# Patient Record
Sex: Male | Born: 1970 | Race: White | Hispanic: No | Marital: Married | Smoking: Never smoker
Health system: Southern US, Community
[De-identification: ages and names within clinical notes are randomized; demographics above are authoritative.]

## PROBLEM LIST (undated history)

## (undated) DIAGNOSIS — Z4659 Encounter for fitting and adjustment of other gastrointestinal appliance and device: Secondary | ICD-10-CM

## (undated) DIAGNOSIS — F419 Anxiety disorder, unspecified: Secondary | ICD-10-CM

## (undated) DIAGNOSIS — D75829 Heparin-induced thrombocytopenia, unspecified: Secondary | ICD-10-CM

## (undated) DIAGNOSIS — Z8709 Personal history of other diseases of the respiratory system: Secondary | ICD-10-CM

## (undated) DIAGNOSIS — J101 Influenza due to other identified influenza virus with other respiratory manifestations: Secondary | ICD-10-CM

## (undated) DIAGNOSIS — D7582 Heparin induced thrombocytopenia (HIT): Secondary | ICD-10-CM

---

## 1997-12-12 ENCOUNTER — Emergency Department (HOSPITAL_COMMUNITY): Admission: EM | Admit: 1997-12-12 | Discharge: 1997-12-12 | Payer: Self-pay | Admitting: Emergency Medicine

## 2000-06-11 ENCOUNTER — Emergency Department (HOSPITAL_COMMUNITY): Admission: EM | Admit: 2000-06-11 | Discharge: 2000-06-11 | Payer: Self-pay | Admitting: Internal Medicine

## 2000-06-11 ENCOUNTER — Encounter: Payer: Self-pay | Admitting: Internal Medicine

## 2001-02-23 ENCOUNTER — Ambulatory Visit (HOSPITAL_COMMUNITY): Admission: RE | Admit: 2001-02-23 | Discharge: 2001-02-23 | Payer: Self-pay | Admitting: Family Medicine

## 2001-02-23 ENCOUNTER — Encounter: Payer: Self-pay | Admitting: Family Medicine

## 2003-12-13 ENCOUNTER — Ambulatory Visit (HOSPITAL_COMMUNITY): Admission: RE | Admit: 2003-12-13 | Discharge: 2003-12-13 | Payer: Self-pay | Admitting: Family Medicine

## 2004-09-07 ENCOUNTER — Ambulatory Visit (HOSPITAL_COMMUNITY): Admission: RE | Admit: 2004-09-07 | Discharge: 2004-09-07 | Payer: Self-pay | Admitting: Family Medicine

## 2005-10-13 ENCOUNTER — Ambulatory Visit (HOSPITAL_COMMUNITY): Admission: RE | Admit: 2005-10-13 | Discharge: 2005-10-13 | Payer: Self-pay | Admitting: Family Medicine

## 2008-04-11 ENCOUNTER — Emergency Department (HOSPITAL_COMMUNITY): Admission: EM | Admit: 2008-04-11 | Discharge: 2008-04-11 | Payer: Self-pay | Admitting: Emergency Medicine

## 2008-11-25 ENCOUNTER — Emergency Department (HOSPITAL_COMMUNITY): Admission: EM | Admit: 2008-11-25 | Discharge: 2008-11-26 | Payer: Self-pay | Admitting: Emergency Medicine

## 2010-05-21 LAB — BASIC METABOLIC PANEL
BUN: 12 mg/dL (ref 6–23)
CO2: 25 mEq/L (ref 19–32)
Chloride: 111 mEq/L (ref 96–112)
GFR calc Af Amer: >60 mL/min (ref >60)
Potassium: 3.6 mEq/L (ref 3.5–5.1)

## 2010-05-21 LAB — DIFFERENTIAL
Eosinophils Absolute: 0.1 10*3/uL (ref 0.0–0.7)
Eosinophils Relative: 1 % (ref 0–5)
Lymphs Abs: 2 10*3/uL (ref 0.7–4.0)
Monocytes Relative: 9 % (ref 3–12)

## 2010-05-21 LAB — CBC
HCT: 42.7 % (ref 39.0–52.0)
MCV: 85.6 fL (ref 78.0–100.0)
RBC: 4.98 MIL/uL (ref 4.22–5.81)
WBC: 9.5 10*3/uL (ref 4.0–10.5)

## 2011-05-14 ENCOUNTER — Inpatient Hospital Stay (HOSPITAL_COMMUNITY)
Admission: AD | Admit: 2011-05-14 | Payer: BC Managed Care – PPO | Source: Ambulatory Visit | Admitting: Interventional Cardiology

## 2012-04-23 ENCOUNTER — Emergency Department (HOSPITAL_COMMUNITY): Payer: No Typology Code available for payment source

## 2012-04-23 ENCOUNTER — Emergency Department (HOSPITAL_COMMUNITY)
Admission: EM | Admit: 2012-04-23 | Discharge: 2012-04-23 | Disposition: A | Discharge status: Home / Self Care | Payer: No Typology Code available for payment source | Attending: Emergency Medicine | Admitting: Emergency Medicine

## 2012-04-23 ENCOUNTER — Other Ambulatory Visit: Payer: Self-pay

## 2012-04-23 ENCOUNTER — Encounter (HOSPITAL_COMMUNITY): Payer: Self-pay

## 2012-04-23 DIAGNOSIS — F411 Generalized anxiety disorder: Secondary | ICD-10-CM | POA: Insufficient documentation

## 2012-04-23 DIAGNOSIS — I10 Essential (primary) hypertension: Secondary | ICD-10-CM | POA: Insufficient documentation

## 2012-04-23 DIAGNOSIS — R509 Fever, unspecified: Secondary | ICD-10-CM | POA: Insufficient documentation

## 2012-04-23 DIAGNOSIS — Z862 Personal history of diseases of the blood and blood-forming organs and certain disorders involving the immune mechanism: Secondary | ICD-10-CM | POA: Insufficient documentation

## 2012-04-23 DIAGNOSIS — Z7901 Long term (current) use of anticoagulants: Secondary | ICD-10-CM | POA: Insufficient documentation

## 2012-04-23 DIAGNOSIS — Z8709 Personal history of other diseases of the respiratory system: Secondary | ICD-10-CM | POA: Insufficient documentation

## 2012-04-23 DIAGNOSIS — Z79899 Other long term (current) drug therapy: Secondary | ICD-10-CM | POA: Insufficient documentation

## 2012-04-23 DIAGNOSIS — R0602 Shortness of breath: Secondary | ICD-10-CM | POA: Insufficient documentation

## 2012-04-23 DIAGNOSIS — R002 Palpitations: Secondary | ICD-10-CM | POA: Insufficient documentation

## 2012-04-23 DIAGNOSIS — R110 Nausea: Secondary | ICD-10-CM | POA: Insufficient documentation

## 2012-04-23 DIAGNOSIS — R51 Headache: Secondary | ICD-10-CM | POA: Insufficient documentation

## 2012-04-23 HISTORY — DX: Encounter for fitting and adjustment of other gastrointestinal appliance and device: Z46.59

## 2012-04-23 HISTORY — DX: Anxiety disorder, unspecified: F41.9

## 2012-04-23 HISTORY — DX: Heparin-induced thrombocytopenia, unspecified: D75.829

## 2012-04-23 HISTORY — DX: Heparin induced thrombocytopenia (HIT): D75.82

## 2012-04-23 HISTORY — DX: Influenza due to other identified influenza virus with other respiratory manifestations: J10.1

## 2012-04-23 HISTORY — DX: Personal history of other diseases of the respiratory system: Z87.09

## 2012-04-23 LAB — PROTIME-INR
INR: 1.68 — ABNORMAL HIGH (ref 0.00–1.49)
Prothrombin Time: 19.2 seconds — ABNORMAL HIGH (ref 11.6–15.2)

## 2012-04-23 LAB — CBC WITH DIFFERENTIAL/PLATELET
Basophils Absolute: 0 10*3/uL (ref 0.0–0.1)
Eosinophils Relative: 1 % (ref 0–5)
Lymphocytes Relative: 15 % (ref 12–46)
MCV: 84.1 fL (ref 78.0–100.0)
Platelets: 366 10*3/uL (ref 150–400)
RDW: 13.8 % (ref 11.5–15.5)
WBC: 12.3 10*3/uL — ABNORMAL HIGH (ref 4.0–10.5)

## 2012-04-23 LAB — BASIC METABOLIC PANEL
CO2: 23 mEq/L (ref 19–32)
Calcium: 10 mg/dL (ref 8.4–10.5)
GFR calc Af Amer: >90 mL/min (ref >90)
GFR calc non Af Amer: 86 mL/min — ABNORMAL LOW (ref >90)
Sodium: 138 mEq/L (ref 135–145)

## 2012-04-23 LAB — URINALYSIS, ROUTINE W REFLEX MICROSCOPIC
Hgb urine dipstick: NEGATIVE
Leukocytes, UA: NEGATIVE
Protein, ur: NEGATIVE mg/dL
Urobilinogen, UA: 0.2 mg/dL (ref 0.0–1.0)

## 2012-04-23 LAB — TROPONIN I
Troponin I: <0.3 ng/mL (ref <0.30)
Troponin I: <0.3 ng/mL (ref <0.30)

## 2012-04-23 MED ORDER — ONDANSETRON HCL 4 MG/2ML IJ SOLN
4.0000 mg | Freq: Once | INTRAMUSCULAR | Status: AC
  Administered 2012-04-23: 4 mg via INTRAVENOUS

## 2012-04-23 MED ORDER — SODIUM CHLORIDE 0.9 % IV SOLN
INTRAVENOUS | Status: DC
  Administered 2012-04-23: 16:00:00 via INTRAVENOUS

## 2012-04-23 MED ORDER — ONDANSETRON HCL 4 MG/2ML IJ SOLN
INTRAMUSCULAR | Status: AC
  Administered 2012-04-23: 4 mg via INTRAVENOUS
  Filled 2012-04-23: qty 2

## 2012-04-23 MED ORDER — ACETAMINOPHEN 325 MG PO TABS
650.0000 mg | ORAL_TABLET | Freq: Once | ORAL | Status: AC
  Administered 2012-04-23: 650 mg via ORAL
  Filled 2012-04-23: qty 2

## 2012-04-23 MED ORDER — IOHEXOL 350 MG/ML SOLN
100.0000 mL | Freq: Once | INTRAVENOUS | Status: AC | PRN
  Administered 2012-04-23: 100 mL via INTRAVENOUS

## 2012-04-23 MED ORDER — SODIUM CHLORIDE 0.9 % IV BOLUS (SEPSIS)
250.0000 mL | Freq: Once | INTRAVENOUS | Status: AC
  Administered 2012-04-23: 250 mL via INTRAVENOUS

## 2012-04-23 NOTE — ED Notes (Signed)
Discharge instructions given and reviewed with patient.  Patient verbalized understanding to follow up with his primary doctor at [facility].  Patient ambulatory; refused wheelchair.  Discharged home in good condition.

## 2012-04-23 NOTE — ED Provider Notes (Signed)
History    This chart was scribed for Gregory Jakes, MD by Sofie Rower, ED Scribe. The patient was seen in room APA18/AP[facility]18 and the patient's care was started at 3:15PM.    CSN: [id number]  Arrival date & time 04/23/12  1405   First MD Initiated Contact with Patient 04/23/12 1515      Chief Complaint  Patient presents with  . Tachycardia  . Hypertension    (Consider location/radiation/quality/duration/timing/severity/associated sxs/prior treatment) Patient is a 42 y.o. male presenting with general illness. The history is provided by the patient. No language interpreter was used.  Illness  The current episode started today. The onset was sudden. The problem occurs rarely. The problem has been gradually improving. The problem is moderate. The symptoms are relieved by rest. Nothing aggravates the symptoms. Associated symptoms include a fever, nausea and headaches. Pertinent negatives include no abdominal pain, no diarrhea, no neck pain and no rash. The fever has been present for less than 1 day. His temperature was unmeasured prior to arrival. He has been behaving normally. Recently, medical care has been given at another facility (Hospitalized at [facility] for 41 days with H1N1, relased 03/21/12.).    Gregory Barton is a 42 y.o. 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  Associated symptoms include SOB, nausea, fever (unknown maximum temperature PTA, 98.1 taken at [facility] this afternoon, 04/23/12), and headache. The pt reports he was at [employer] this afternoon, when he suddenly began experiencing a fluttering, tachycardic sensation within his chest. The pt informs he believes his heart rate was in the 130's. Furthermore, the pt reports he still  has a feeding tube in place with regards to a prior Hospitalization at [facility], where which he was diagnosed with theH1N1 virus.  Important to note, the pt is currently taking coumadin and his INR measured 1.6 last night (04/22/12).  The pt denies CP, upper extremity pain, vomiting, chills, abdominal pain, diarrhea, dysuria, visual disturbance, rash, neck pain, and back pain.   The pt does not smoke or drink alcohol.   PCP is Dr. Roxy Horseman.    Past Medical History  Diagnosis Date  . H1N1 influenza   . Anxiety   . History of ARDS   . Encounter for feeding tube placement   . Heparin induced thrombocytopenia     History reviewed. No pertinent past surgical history.  No family history on file.  History  Substance Use Topics  . Smoking status: Never Smoker   . Smokeless tobacco: Not on file  . Alcohol Use: No      Review of Systems  Constitutional: Positive for fever. Negative for chills.  HENT: Negative for neck pain.   Eyes: Negative for visual disturbance.  Respiratory: Positive for shortness of breath.   Cardiovascular: Negative for chest pain.  Gastrointestinal: Positive for nausea. Negative for abdominal pain and diarrhea.  Genitourinary: Negative for dysuria.  Musculoskeletal: Negative for back pain and arthralgias.  Skin: Negative for rash.  Neurological: Positive for headaches.    Allergies  Heparin; Lovenox; Meropenem; and Zosyn  Home Medications   Current Outpatient Rx  Name  Route  Sig  Dispense  Refill  . LORazepam (ATIVAN) 1 MG tablet   Oral   Take 0.5-1 mg by mouth every 8 (eight)  hours.         . Multiple Vitamin (MULTIVITAMIN WITH MINERALS) TABS   Oral   Take 1 tablet by mouth every evening.         . sertraline (ZOLOFT) 100 MG tablet   Oral   Take 100 mg by mouth daily.         . tamsulosin (FLOMAX) 0.4 MG CAPS   Oral   Take 0.4 mg by mouth every evening.         . warfarin (COUMADIN) 10 MG tablet   Oral   Take 10-15 mg by  mouth every evening. Alternate taking 15mg  one evening and 10mg  the next evening. (10,15,10,15...)           BP 141/90  Pulse 99  Temp(Src) 98.1 F (36.7 C) (Oral)  Resp 22  Ht 6' (1.829 m)  Wt 225 lb (102.059 kg)  BMI 30.51 kg/m2  SpO2 100%  Physical Exam  Nursing note and vitals reviewed. Constitutional: He is oriented to person, place, and time. He appears well-developed and well-nourished. No distress.  HENT:  Head: Normocephalic and atraumatic.  Nose: Nose normal.  Mouth/Throat: Oropharynx is clear and moist.  Eyes: Conjunctivae and EOM are normal. Pupils are equal, round, and reactive to light. No scleral icterus.  Eyes tracking well, Pupils normal.   Neck: Neck supple. No tracheal deviation present.  Cardiovascular: Normal rate, regular rhythm and normal heart sounds.   Pulmonary/Chest: Effort normal and breath sounds normal. No respiratory distress. He has no wheezes.  Abdominal: Soft. Bowel sounds are normal. He exhibits no distension. There is no tenderness.  LUQ G Tube present.   Musculoskeletal: Normal range of motion. He exhibits no edema.  Neurological: He is alert and oriented to person, place, and time. No cranial nerve deficit or sensory deficit. Coordination normal.  Facial muscles working well.   Skin: Skin is warm and dry.  Psychiatric: He has a normal mood and affect. His behavior is normal.    ED Course  Procedures (including critical care time)  DIAGNOSTIC STUDIES: Oxygen Saturation is 100% on room air, normal by my interpretation.    COORDINATION OF CARE:   3:28 PM- Treatment plan concerning evaluation of coumadin level, heart markers, and head CT discussed with patient. Pt agrees with treatment.  5:40 PM- Recheck. Treatment plan discussed with patient. Pt agrees with treatment.      Results for orders placed during the hospital encounter of 04/23/12  TROPONIN I      Result Value Range   Troponin I <0.30  <0.30 ng/mL  PROTIME-INR       Result Value Range   Prothrombin Time 19.2 (*) 11.6 - 15.2 seconds   INR 1.68 (*) 0.00 - 1.49  URINALYSIS, ROUTINE W REFLEX MICROSCOPIC      Result Value Range   Color, Urine YELLOW  YELLOW   APPearance CLEAR  CLEAR   Specific Gravity, Urine 1.010  1.005 - 1.030   pH 6.5  5.0 - 8.0   Glucose, UA NEGATIVE  NEGATIVE mg/dL   Hgb urine dipstick NEGATIVE  NEGATIVE   Bilirubin Urine NEGATIVE  NEGATIVE   Ketones, ur NEGATIVE  NEGATIVE mg/dL   Protein, ur NEGATIVE  NEGATIVE mg/dL   Urobilinogen, UA 0.2  0.0 - 1.0 mg/dL   Nitrite NEGATIVE  NEGATIVE   Leukocytes, UA NEGATIVE  NEGATIVE  CBC WITH DIFFERENTIAL      Result Value Range   WBC 12.3 (*) 4.0 - 10.5 K/uL  RBC 4.64  4.22 - 5.81 MIL/uL   Hemoglobin 13.7  13.0 - 17.0 g/dL   HCT 16.1  09.6 - 04.5 %   MCV 84.1  78.0 - 100.0 fL   MCH 29.5  26.0 - 34.0 pg   MCHC 35.1  30.0 - 36.0 g/dL   RDW 40.9  81.1 - 91.4 %   Platelets 366  150 - 400 K/uL   Neutrophils Relative 78 (*) 43 - 77 %   Neutro Abs 9.5 (*) 1.7 - 7.7 K/uL   Lymphocytes Relative 15  12 - 46 %   Lymphs Abs 1.9  0.7 - 4.0 K/uL   Monocytes Relative 6  3 - 12 %   Monocytes Absolute 0.8  0.1 - 1.0 K/uL   Eosinophils Relative 1  0 - 5 %   Eosinophils Absolute 0.1  0.0 - 0.7 K/uL   Basophils Relative 0  0 - 1 %   Basophils Absolute 0.0  0.0 - 0.1 K/uL  BASIC METABOLIC PANEL      Result Value Range   Sodium 138  135 - 145 mEq/L   Potassium 3.7  3.5 - 5.1 mEq/L   Chloride 102  96 - 112 mEq/L   CO2 23  19 - 32 mEq/L   Glucose, Bld 90  70 - 99 mg/dL   BUN 14  6 - 23 mg/dL   Creatinine, Ser 7.82  0.50 - 1.35 mg/dL   Calcium 95.6  8.4 - 21.3 mg/dL   GFR calc non Af Amer 86 (*) >90 mL/min   GFR calc Af Amer >90  >90 mL/min  TROPONIN I      Result Value Range   Troponin I <0.30  <0.30 ng/mL   Dg Chest 2 View  04/23/2012  *RADIOLOGY REPORT*  Clinical Data: Weakness and chest pain.  CHEST - 2 VIEW  Comparison: Single view of the chest 11/25/2008.  Findings: The lungs are clear.   Heart size is normal.  No pneumothorax or pleural fluid.  IMPRESSION: No acute abnormality.   Original Report Authenticated By: Holley Dexter, M.D.    Ct Head Wo Contrast  04/23/2012  *RADIOLOGY REPORT*  Clinical Data: Tachycardia, known blood clot in neck, recent H1N1 hospitalization, hypertension  CT HEAD WITHOUT CONTRAST  Technique:  Contiguous axial images were obtained from the base of the skull through the vertex without contrast.  Comparison: None  Findings: Normal ventricular morphology. No midline shift or mass effect. Normal appearance of brain parenchyma. No intracranial hemorrhage, mass lesion, or acute infarction. Visualized paranasal sinuses and mastoid air cells clear. Bones unremarkable.  IMPRESSION: No acute intracranial abnormalities.   Original Report Authenticated By: Ulyses Southward, M.D.    Ct Angio Chest Pe W/cm &/or Wo Cm  04/23/2012  *RADIOLOGY REPORT*  Clinical Data: Tachycardia, hypertension  CT ANGIOGRAPHY CHEST  Technique:  Multidetector CT imaging of the chest using the standard protocol during bolus administration of intravenous contrast. Multiplanar reconstructed images including MIPs were obtained and reviewed to evaluate the vascular anatomy.  Contrast: OMNIPAQUE IOHEXOL 350 MG/ML SOLN  Comparison: Chest radiograph dated 04/23/2012.  CT chest dated 10/13/2005.  Findings: No evidence of pulmonary embolism.  Mild paraseptal emphysematous changes in the bilateral upper lobes. Multiple small pulmonary nodules measuring up to 3-4 mm, unchanged from 2007, benign.  No pleural effusion or pneumothorax.  1.5 cm right thyroid nodule (series 4/image 4).  The heart is normal in size.  No pericardial effusion.  No suspicious mediastinal, hilar, or axillary  lymphadenopathy.  Visualized upper abdomen is unremarkable.  Mild degenerative changes of the visualized thoracolumbar spine.  IMPRESSION: No evidence of pulmonary embolism.  Mild paraseptal emphysematous changes.  Multiple small  pulmonary nodules measuring up to 3-4 mm, unchanged from 2007, benign.  1.5 cm right thyroid nodule.  Consider thyroid ultrasound for further evaluation as clinically warranted.   Original Report Authenticated By: Charline Bills, M.D.           Date: 04/23/2012  Rate: 82  Rhythm: normal sinus rhythm and premature atrial contractions (PAC)  QRS Axis: normal  Intervals: normal  ST/T Wave abnormalities: normal  Conduction Disutrbances:none  Narrative Interpretation:   Old EKG Reviewed: unchanged From 11/25/08   1. Heart palpitations       MDM  Patient followed by primary care doctor at [facility]. Patient was feeling of heart palpitations today he has not had symptoms like that before they now resolved cardiac monitoring showed no significant arrhythmias EKG did have some premature atrial contractions they were present on old he PG as well. Patient is on Coumadin INR is still subtherapeutic he'll need to followup in the next few days for recheck of that. However he recently had increase in his Coumadin dosage so may not had a chance to kick in yet. CT scan angios rules out pulmonary embolism. No evidence of pneumonia no evidence of pneumothorax. Troponin markers x2 with a negative EKG as stated without any acute cardiac findings. Patient's labs without any significant abnormalities.  CT angiographic raise concern for a thyroid nodule that will require followup and probably thyroid function testing. Patient will return for any new or worse symptoms.      I personally performed the services described in this documentation, which was scribed in my presence. The recorded information has been reviewed and is accurate.     Gregory Jakes, MD 04/23/12 2003
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
## 2017-04-15 ENCOUNTER — Other Ambulatory Visit: Payer: Self-pay

## 2017-04-15 ENCOUNTER — Encounter: Payer: Self-pay | Admitting: *Deleted

## 2017-04-15 ENCOUNTER — Emergency Department: Payer: BLUE CROSS/BLUE SHIELD

## 2017-04-15 ENCOUNTER — Emergency Department
Admission: EM | Admit: 2017-04-15 | Discharge: 2017-04-15 | Disposition: A | Discharge status: Home / Self Care | Payer: BLUE CROSS/BLUE SHIELD | Attending: Emergency Medicine | Admitting: Emergency Medicine

## 2017-04-15 DIAGNOSIS — S161XXA Strain of muscle, fascia and tendon at neck level, initial encounter: Secondary | ICD-10-CM | POA: Insufficient documentation

## 2017-04-15 DIAGNOSIS — Y92410 Unspecified street and highway as the place of occurrence of the external cause: Secondary | ICD-10-CM | POA: Insufficient documentation

## 2017-04-15 DIAGNOSIS — Y999 Unspecified external cause status: Secondary | ICD-10-CM | POA: Insufficient documentation

## 2017-04-15 DIAGNOSIS — S39012A Strain of muscle, fascia and tendon of lower back, initial encounter: Secondary | ICD-10-CM | POA: Diagnosis not present

## 2017-04-15 DIAGNOSIS — S199XXA Unspecified injury of neck, initial encounter: Secondary | ICD-10-CM | POA: Diagnosis present

## 2017-04-15 DIAGNOSIS — Y9389 Activity, other specified: Secondary | ICD-10-CM | POA: Diagnosis not present

## 2017-04-15 MED ORDER — OXYCODONE-ACETAMINOPHEN 5-325 MG PO TABS: 1.0000 | ORAL_TABLET | Freq: Three times a day (TID) | ORAL | 0 refills | Status: AC | PRN

## 2017-04-15 MED ORDER — DIAZEPAM 5 MG PO TABS: 5.0000 mg | ORAL_TABLET | Freq: Three times a day (TID) | ORAL | 0 refills | Status: AC | PRN

## 2017-04-15 MED ORDER — OXYCODONE-ACETAMINOPHEN 5-325 MG PO TABS
2.0000 | ORAL_TABLET | Freq: Once | ORAL | Status: AC
  Administered 2017-04-15: 2 via ORAL
  Filled 2017-04-15: qty 2
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
## 2017-06-02 ENCOUNTER — Ambulatory Visit: Payer: Self-pay | Admitting: Psychiatry
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
## 2019-10-09 ENCOUNTER — Other Ambulatory Visit: Payer: Self-pay | Admitting: Emergency Medicine

## 2019-10-09 DIAGNOSIS — M5412 Radiculopathy, cervical region: Secondary | ICD-10-CM

## 2019-10-20 ENCOUNTER — Other Ambulatory Visit: Payer: No Typology Code available for payment source

## 2019-11-17 ENCOUNTER — Other Ambulatory Visit: Payer: No Typology Code available for payment source

## 2021-04-15 ENCOUNTER — Ambulatory Visit
Admission: RE | Admit: 2021-04-15 | Discharge: 2021-04-15 | Disposition: A | Discharge status: Home / Self Care | Payer: 59 | Source: Ambulatory Visit

## 2021-04-15 ENCOUNTER — Other Ambulatory Visit: Payer: Self-pay

## 2021-04-15 VITALS — BP 131/77 | HR 72 | Temp 98.8°F | Resp 18

## 2021-04-15 DIAGNOSIS — J0100 Acute maxillary sinusitis, unspecified: Secondary | ICD-10-CM

## 2021-04-15 MED ORDER — AMOXICILLIN-POT CLAVULANATE 875-125 MG PO TABS: 1.0000 | ORAL_TABLET | Freq: Two times a day (BID) | ORAL | 0 refills | Status: AC

## 2021-04-15 MED ORDER — PREDNISONE 20 MG PO TABS: 40.0000 mg | ORAL_TABLET | Freq: Every day | ORAL | 0 refills | Status: AC

## 2021-04-15 NOTE — ED Provider Notes (Signed)
?[facility] ? ? ? ?CSN: [id number] ?Arrival date & time: 04/15/21  1200 ? ? ?  ? ?History   ?Chief Complaint ?No chief complaint on file. ? ? ?HPI ?Gregory Barton is a 51 y.o. male.  ? ?Presenting today with 1 week history of thick nasal congestion, sinus pain and pressure, sinus headache, malaise.  Denies chest pain, shortness of breath, fever, chills, body aches, abdominal pain, nausea vomiting or diarrhea.  Taking Mucinex, Claritin with minimal relief of symptoms.  Takes Claritin daily for seasonal allergies.  No known sick contacts recently. ? ?Past Medical History:  ?Diagnosis Date  ? Anxiety   ? Encounter for feeding tube placement   ? H1N1 influenza   ? Heparin induced thrombocytopenia   ? History of ARDS   ? ?There are no problems to display for this patient. ? ?History reviewed. No pertinent surgical history. ? ? ? ? ?Home Medications   ? ?Prior to Admission medications   ?Medication Sig Start Date End Date Taking? Authorizing Provider  ?amoxicillin-clavulanate (AUGMENTIN) 875-125 MG tablet Take 1 tablet by mouth every 12 (twelve) hours. 04/15/21  Yes Volney American, PA-C  ?escitalopram (LEXAPRO) 10 MG tablet Take 10 mg by mouth daily.   Yes [provider]  ?loratadine (CLARITIN) 10 MG tablet Take 10 mg by mouth daily.   Yes [provider]  ?losartan (COZAAR) 25 MG tablet Take 25 mg by mouth daily.   Yes [provider]  ?predniSONE (DELTASONE) 20 MG tablet Take 2 tablets (40 mg total) by mouth daily with breakfast. 04/15/21  Yes Volney American, PA-C  ?diazepam (VALIUM) 5 MG tablet Take 1 tablet (5 mg total) by mouth every 8 (eight) hours as needed for muscle spasms. 04/15/17   Earleen Newport, MD  ?LORazepam (ATIVAN) 1 MG tablet Take 0.5-1 mg by mouth every 8 (eight) hours.    [provider]  ?Multiple Vitamin (MULTIVITAMIN WITH MINERALS) TABS Take 1 tablet by mouth every evening.    [provider]  ?oxyCODONE-acetaminophen  (PERCOCET) 5-325 MG tablet Take 1-2 tablets by mouth every 8 (eight) hours as needed. 04/15/17   Earleen Newport, MD  ?sertraline (ZOLOFT) 100 MG tablet Take 100 mg by mouth daily.    [provider]  ?tamsulosin (FLOMAX) 0.4 MG CAPS Take 0.4 mg by mouth every evening.    [provider]  ?warfarin (COUMADIN) 10 MG tablet Take 10-15 mg by mouth every evening. Alternate taking 15mg  one evening and 10mg  the next evening. (10,15,10,15...)    [provider]  ? ? ?Family History ?History reviewed. No pertinent family history. ? ?Social History ?Social History  ? ?Tobacco Use  ? Smoking status: Never  ? Smokeless tobacco: Never  ?Substance Use Topics  ? Alcohol use: No  ? Drug use: No  ? ? ? ?Allergies   ?Heparin, Lovenox [enoxaparin sodium], Meropenem, and Zosyn [piperacillin sod-tazobactam so] ? ? ?Review of Systems ?Review of Systems ?Per HPI ? ?Physical Exam ?Triage Vital Signs ?ED Triage Vitals  ?Enc Vitals Group  ?   BP 04/15/21 1215 131/77  ?   Pulse Rate 04/15/21 1215 72  ?   Resp 04/15/21 1215 18  ?   Temp 04/15/21 1215 98.8 ?F (37.1 ?C)  ?   Temp Source 04/15/21 1215 Oral  ?   SpO2 04/15/21 1215 97 %  ?   Weight --   ?   Height --   ?   Head Circumference --   ?  Peak Flow --   ?   Pain Score 04/15/21 1216 0  ?   Pain Loc --   ?   Pain Edu? --   ?   Excl. in [place]? --   ? ?No data found. ? ?Updated Vital Signs ?BP 131/77 (BP Location: Right Arm)   Pulse 72   Temp 98.8 ?F (37.1 ?C) (Oral)   Resp 18   SpO2 97%  ? ?Visual Acuity ?Right Eye Distance:   ?Left Eye Distance:   ?Bilateral Distance:   ? ?Right Eye Near:   ?Left Eye Near:    ?Bilateral Near:    ? ?Physical Exam ?Vitals and nursing note reviewed.  ?Constitutional:   ?   Appearance: He is well-developed.  ?HENT:  ?   Head: Atraumatic.  ?   Right Ear: External ear normal.  ?   Left Ear: External ear normal.  ?   Nose: Congestion present.  ?   Comments: Bilateral maxillary sinuses tender to palpation ?   Mouth/Throat:  ?    Pharynx: Posterior oropharyngeal erythema present. No oropharyngeal exudate.  ?Eyes:  ?   Conjunctiva/sclera: Conjunctivae normal.  ?   Pupils: Pupils are equal, round, and reactive to light.  ?Cardiovascular:  ?   Rate and Rhythm: Normal rate and regular rhythm.  ?Pulmonary:  ?   Effort: Pulmonary effort is normal. No respiratory distress.  ?   Breath sounds: No wheezing or rales.  ?Musculoskeletal:     ?   General: Normal range of motion.  ?   Cervical back: Normal range of motion and neck supple.  ?Lymphadenopathy:  ?   Cervical: No cervical adenopathy.  ?Skin: ?   General: Skin is warm and dry.  ?Neurological:  ?   Mental Status: He is alert and oriented to person, place, and time.  ?Psychiatric:     ?   Behavior: Behavior normal.  ? ? ? ?UC Treatments / Results  ?Labs ?(all labs ordered are listed, but only abnormal results are displayed) ?Labs Reviewed - No data to display ? ?EKG ? ? ?Radiology ?No results found. ? ?Procedures ?Procedures (including critical care time) ? ?Medications Ordered in UC ?Medications - No data to display ? ?Initial Impression / Assessment and Plan / UC Course  ?I have reviewed the triage vital signs and the nursing notes. ? ?Pertinent labs & imaging results that were available during my care of the patient were reviewed by me and considered in my medical decision making (see chart for details). ? ? Vitals overall reassuring today, suspect maxillary sinusitis.  Treat with Augmentin, prednisone and supportive over-the-counter medications and home care.  Continue allergy regimen.  States he is tolerated Augmentin well in the past though allergy warning did appear upon standing. ? ?Final Clinical Impressions(s) / UC Diagnoses  ? ?Final diagnoses:  ?Acute maxillary sinusitis, recurrence not specified  ? ?Discharge Instructions   ?None ?  ? ?ED Prescriptions   ? ? Medication Sig Dispense Auth. Provider  ? amoxicillin-clavulanate (AUGMENTIN) 875-125 MG tablet Take 1 tablet by mouth every 12  (twelve) hours. 14 tablet Volney American, [place]  ? predniSONE (DELTASONE) 20 MG tablet Take 2 tablets (40 mg total) by mouth daily with breakfast. 6 tablet Volney American, [place]  ? ?  ? ?PDMP not reviewed this encounter. ?  ?Volney American, PA-C ?04/15/21 1303 ? ?

## 2021-04-15 NOTE — ED Triage Notes (Signed)
Post nasal drip that started Friday.  Yellow nasal congestions.  States he feels bad.  Home covid test last night was negative. ?

## 2021-07-22 DIAGNOSIS — J329 Chronic sinusitis, unspecified: Secondary | ICD-10-CM | POA: Diagnosis not present

## 2021-08-14 DIAGNOSIS — M542 Cervicalgia: Secondary | ICD-10-CM | POA: Diagnosis not present

## 2021-08-14 DIAGNOSIS — M25512 Pain in left shoulder: Secondary | ICD-10-CM | POA: Diagnosis not present

## 2021-08-14 DIAGNOSIS — G8929 Other chronic pain: Secondary | ICD-10-CM | POA: Diagnosis not present

## 2021-08-14 DIAGNOSIS — J019 Acute sinusitis, unspecified: Secondary | ICD-10-CM | POA: Diagnosis not present

## 2021-08-14 DIAGNOSIS — J029 Acute pharyngitis, unspecified: Secondary | ICD-10-CM | POA: Diagnosis not present

## 2021-08-14 DIAGNOSIS — R69 Illness, unspecified: Secondary | ICD-10-CM | POA: Diagnosis not present

## 2021-10-30 DIAGNOSIS — I10 Essential (primary) hypertension: Secondary | ICD-10-CM | POA: Diagnosis not present

## 2023-03-01 DIAGNOSIS — J019 Acute sinusitis, unspecified: Secondary | ICD-10-CM | POA: Diagnosis not present

## 2023-03-01 DIAGNOSIS — F419 Anxiety disorder, unspecified: Secondary | ICD-10-CM | POA: Diagnosis not present

## 2023-03-01 DIAGNOSIS — R519 Headache, unspecified: Secondary | ICD-10-CM | POA: Diagnosis not present

## 2023-03-01 DIAGNOSIS — G4719 Other hypersomnia: Secondary | ICD-10-CM | POA: Diagnosis not present

## 2023-03-01 DIAGNOSIS — I10 Essential (primary) hypertension: Secondary | ICD-10-CM | POA: Diagnosis not present

## 2023-03-01 DIAGNOSIS — G4733 Obstructive sleep apnea (adult) (pediatric): Secondary | ICD-10-CM | POA: Diagnosis not present

## 2023-03-01 DIAGNOSIS — R718 Other abnormality of red blood cells: Secondary | ICD-10-CM | POA: Diagnosis not present

## 2023-03-08 ENCOUNTER — Other Ambulatory Visit: Payer: Self-pay | Admitting: Physician Assistant

## 2023-03-08 DIAGNOSIS — I10 Essential (primary) hypertension: Secondary | ICD-10-CM

## 2023-03-16 ENCOUNTER — Encounter: Payer: Self-pay | Admitting: Physician Assistant

## 2023-03-18 ENCOUNTER — Other Ambulatory Visit: Payer: 59

## 2023-03-22 ENCOUNTER — Ambulatory Visit
Admission: RE | Admit: 2023-03-22 | Discharge: 2023-03-22 | Disposition: A | Discharge status: Home / Self Care | Payer: 59 | Source: Ambulatory Visit | Attending: Physician Assistant | Admitting: Physician Assistant

## 2023-03-22 DIAGNOSIS — I10 Essential (primary) hypertension: Secondary | ICD-10-CM

## 2023-03-25 ENCOUNTER — Other Ambulatory Visit: Payer: 59

## 2023-07-30 ENCOUNTER — Other Ambulatory Visit: Payer: Self-pay

## 2023-07-30 ENCOUNTER — Emergency Department (HOSPITAL_COMMUNITY)
Admission: EM | Admit: 2023-07-30 | Discharge: 2023-07-31 | Disposition: A | Discharge status: Home / Self Care | Attending: Emergency Medicine | Admitting: Emergency Medicine

## 2023-07-30 DIAGNOSIS — W260XXA Contact with knife, initial encounter: Secondary | ICD-10-CM | POA: Diagnosis not present

## 2023-07-30 DIAGNOSIS — S61012A Laceration without foreign body of left thumb without damage to nail, initial encounter: Secondary | ICD-10-CM | POA: Insufficient documentation

## 2023-07-30 NOTE — ED Triage Notes (Signed)
 Pt was cutting potatoes and cut his left thumb open

## 2023-07-31 MED ORDER — LIDOCAINE HCL (PF) 2 % IJ SOLN
INTRAMUSCULAR | Status: AC
  Filled 2023-07-31: qty 5

## 2023-07-31 NOTE — ED Notes (Signed)
 ED Provider at bedside.

## 2023-07-31 NOTE — Discharge Instructions (Signed)
 You were evaluated in the Emergency Department and after careful evaluation, we did not find any emergent condition requiring admission or further testing in the hospital.  Your exam/testing today was overall reassuring.  We repaired your laceration with stitches.  These will need to be removed in 7-10 days by a healthcare professional.  Please return to the Emergency Department if you experience any worsening of your condition.  Thank you for allowing us  to be a part of your care.

## 2023-07-31 NOTE — ED Notes (Signed)
 Wound dressed with telfa, kerlex, and coban. Pt tolerated well. Extra dressing supplies sent home with pt.

## 2023-07-31 NOTE — ED Provider Notes (Signed)
 [facility]-EMERGENCY DEPT [facility] Emergency Department Provider Note MRN:  [id number]  Arrival date & time: 07/31/23     Chief Complaint   Laceration   History of Present Illness   Gregory Barton is a 53 y.o. year-old male with no pertinent past medical history presenting to the ED with chief complaint of laceration.  Cut his thumb with a knife while cutting potatoes, bleeding would not stop here for evaluation.  No other injuries, does not take blood thinners.  Review of Systems  A thorough review of systems was obtained and all systems are negative except as noted in the HPI and PMH.   Patient's Health History    Past Medical History:  Diagnosis Date   Anxiety    Encounter for feeding tube placement    H1N1 influenza    Heparin induced thrombocytopenia    History of ARDS     No past surgical history on file.  No family history on file.  Social History   Socioeconomic History   Marital status: Married    Spouse name: Not on file   Number of children: Not on file   Years of education: Not on file   Highest education level: Not on file  Occupational History   Not on file  Tobacco Use   Smoking status: Never   Smokeless tobacco: Never  Substance and Sexual Activity   Alcohol use: No   Drug use: No   Sexual activity: Yes    Birth control/protection: None  Other Topics Concern   Not on file  Social History Narrative   Not on file   Social Drivers of Health   Financial Resource Strain: Low Risk  (04/29/2022)   Received from [facility]   Overall Financial Resource Strain (CARDIA)    Difficulty of Paying Living Expenses: Not hard at all  Food Insecurity: No Food Insecurity (04/29/2022)   Received from [facility]   Hunger Vital Sign    Within the past 12 months, you worried that your food would run out before you got the money to buy more.: Never true    Within the past 12 months, the food you bought just didn't last and you didn't have money to get  more.: Never true  Transportation Needs: No Transportation Needs (04/29/2022)   Received from [facility]   PRAPARE - Transportation    Lack of Transportation (Medical): No    Lack of Transportation (Non-Medical): No  Physical Activity: Not on file  Stress: Not on file  Social Connections: Unknown (06/26/2021)   Received from [facility]   Social Network    Social Network: Not on file  Intimate Partner Violence: Unknown (05/18/2021)   Received from [facility]   HITS    Physically Hurt: Not on file    Insult or Talk Down To: Not on file    Threaten Physical Harm: Not on file    Scream or Curse: Not on file     Physical Exam   Vitals:   07/30/23 2255  BP: (!) 159/83  Pulse: 72  Resp: 18  Temp: 97.8 F (36.6 C)  SpO2: 98%    CONSTITUTIONAL: Well-appearing, NAD NEURO/PSYCH:  Alert and oriented x 3, no focal deficits EYES:  eyes equal and reactive ENT/NECK:  no LAD, no JVD CARDIO: Regular rate, well-perfused, normal S1 and S2 PULM:  CTAB no wheezing or rhonchi GI/GU:  non-distended, non-tender MSK/SPINE:  No gross deformities, no edema SKIN:  no rash, atraumatic   *  Additional and/or pertinent findings included in MDM below  Diagnostic and Interventional Summary    EKG Interpretation Date/Time:    Ventricular Rate:    PR Interval:    QRS Duration:    QT Interval:    QTC Calculation:   R Axis:      Text Interpretation:         Labs Reviewed - No data to display  No orders to display    Medications  lidocaine HCl (PF) (XYLOCAINE) 2 % injection (has no administration in time range)     Procedures  /  Critical Care .Laceration Repair  Date/Time: 07/31/2023 6:51 AM  Performed by: Edson Graces, MD Authorized by: Edson Graces, MD   Consent:    Consent obtained:  Verbal   Consent given by:  Patient   Risks, benefits, and alternatives were discussed: yes     Risks discussed:  Infection, need for additional repair, nerve damage, poor wound  healing, poor cosmetic result, pain, retained foreign body, tendon damage and vascular damage Universal protocol:    Procedure explained and questions answered to patient or proxy's satisfaction: yes     Immediately prior to procedure, a time out was called: yes     Patient identity confirmed:  Verbally with patient Anesthesia:    Anesthesia method:  Local infiltration   Local anesthetic:  Lidocaine 1% w/o epi Laceration details:    Location:  Finger   Finger location:  L thumb   Length (cm):  3   Depth (mm):  2 Pre-procedure details:    Preparation:  Patient was prepped and draped in usual sterile fashion Exploration:    Limited defect created (wound extended): no     Hemostasis achieved with:  Direct pressure   Wound exploration: wound explored through full range of motion and entire depth of wound visualized     Contaminated: no   Treatment:    Amount of cleaning:  Standard Skin repair:    Repair method:  Sutures   Suture size:  4-0   Suture material:  Nylon   Suture technique:  Simple interrupted   Number of sutures:  4 Approximation:    Approximation:  Close Repair type:    Repair type:  Simple Post-procedure details:    Dressing:  Open (no dressing)   Procedure completion:  Tolerated well, no immediate complications   ED Course and Medical Decision Making  Initial Impression and Ddx On exam after removal of the dressing it appears that patient has a small pulsatile vessel causing the persistent bleeding.  Past medical/surgical history that increases complexity of ED encounter: None  Interpretation of Diagnostics Laboratory and/or imaging options to aid in the diagnosis/care of the patient were considered.  After careful history and physical examination, it was determined that there was no indication for diagnostics at this time. Patient Reassessment and Ultimate Disposition/Management     With infiltration of lidocaine bleeding slowed down, wound was thoroughly  rinsed with no foreign body, was not deep enough to have concerns about tendon or bony injury.  Wound closed with simple interrupted stitches and hemostasis achieved.  See further details above.  Appropriate for discharge.  Patient management required discussion with the following services or consulting groups:  None  Complexity of Problems Addressed Acute complicated illness or Injury  Additional Data Reviewed and Analyzed Further history obtained from: None  Additional Factors Impacting ED Encounter Risk Minor Procedures  Merrick Abe. Harless Lien, MD [facility] Emergency Medicine [facility]  Health [email or web address]  Final Clinical Impressions(s) / ED Diagnoses     ICD-10-CM   1. Laceration of left thumb without foreign body without damage to nail, initial encounter  M57.846N       ED Discharge Orders     None        Discharge Instructions Discussed with and Provided to Patient:    Discharge Instructions      You were evaluated in the Emergency Department and after careful evaluation, we did not find any emergent condition requiring admission or further testing in the hospital.  Your exam/testing today was overall reassuring.  We repaired your laceration with stitches.  These will need to be removed in 7-10 days by a healthcare professional.  Please return to the Emergency Department if you experience any worsening of your condition.  Thank you for allowing us  to be a part of your care.       Edson Graces, MD 07/31/23 [id number]
